# Patient Record
Sex: Female | Born: 1965 | Race: White | Hispanic: No | Marital: Married | Smoking: Never smoker
Health system: Southern US, Community
[De-identification: ages and names within clinical notes are randomized; demographics above are authoritative.]

## PROBLEM LIST (undated history)

## (undated) DIAGNOSIS — C8400 Mycosis fungoides, unspecified site: Secondary | ICD-10-CM

## (undated) DIAGNOSIS — I10 Essential (primary) hypertension: Secondary | ICD-10-CM

---

## 2003-11-12 ENCOUNTER — Ambulatory Visit: Payer: Self-pay

## 2013-02-06 ENCOUNTER — Ambulatory Visit: Payer: Self-pay | Admitting: Family Medicine

## 2013-07-16 DIAGNOSIS — C8400 Mycosis fungoides, unspecified site: Secondary | ICD-10-CM

## 2013-07-16 HISTORY — DX: Mycosis fungoides, unspecified site: C84.00

## 2013-08-13 ENCOUNTER — Ambulatory Visit
Admission: RE | Admit: 2013-08-13 | Discharge: 2013-08-13 | Disposition: A | Discharge status: Home / Self Care | Payer: BC Managed Care – PPO | Source: Ambulatory Visit | Attending: Dermatology | Admitting: Dermatology

## 2013-08-13 ENCOUNTER — Other Ambulatory Visit: Payer: Self-pay | Admitting: Dermatology

## 2013-08-13 DIAGNOSIS — R591 Generalized enlarged lymph nodes: Secondary | ICD-10-CM

## 2016-05-29 ENCOUNTER — Other Ambulatory Visit: Payer: Self-pay | Admitting: Student

## 2016-05-29 DIAGNOSIS — Z1231 Encounter for screening mammogram for malignant neoplasm of breast: Secondary | ICD-10-CM

## 2016-09-15 ENCOUNTER — Other Ambulatory Visit: Payer: Self-pay | Admitting: Student

## 2016-09-15 ENCOUNTER — Ambulatory Visit
Admission: RE | Admit: 2016-09-15 | Discharge: 2016-09-15 | Disposition: A | Discharge status: Home / Self Care | Payer: BC Managed Care – PPO | Source: Ambulatory Visit | Attending: Student | Admitting: Student

## 2016-09-15 DIAGNOSIS — Z1231 Encounter for screening mammogram for malignant neoplasm of breast: Secondary | ICD-10-CM

## 2016-09-15 HISTORY — DX: Mycosis fungoides, unspecified site: C84.00

## 2016-11-01 ENCOUNTER — Emergency Department (HOSPITAL_COMMUNITY)
Admission: EM | Admit: 2016-11-01 | Discharge: 2016-11-01 | Disposition: A | Discharge status: Home / Self Care | Payer: BC Managed Care – PPO | Attending: Emergency Medicine | Admitting: Emergency Medicine

## 2016-11-01 ENCOUNTER — Encounter (HOSPITAL_COMMUNITY): Payer: Self-pay | Admitting: Emergency Medicine

## 2016-11-01 ENCOUNTER — Emergency Department (HOSPITAL_COMMUNITY): Payer: BC Managed Care – PPO

## 2016-11-01 DIAGNOSIS — M545 Low back pain: Secondary | ICD-10-CM | POA: Insufficient documentation

## 2016-11-01 DIAGNOSIS — R079 Chest pain, unspecified: Secondary | ICD-10-CM | POA: Diagnosis present

## 2016-11-01 DIAGNOSIS — I10 Essential (primary) hypertension: Secondary | ICD-10-CM | POA: Insufficient documentation

## 2016-11-01 HISTORY — DX: Essential (primary) hypertension: I10

## 2016-11-01 LAB — BASIC METABOLIC PANEL
ANION GAP: 7 (ref 5–15)
BUN: 10 mg/dL (ref 6–20)
CO2: 23 mmol/L (ref 22–32)
Calcium: 9.2 mg/dL (ref 8.9–10.3)
Chloride: 107 mmol/L (ref 101–111)
Creatinine, Ser: 0.88 mg/dL (ref 0.44–1.00)
GFR calc Af Amer: >60 mL/min (ref >60)
Glucose, Bld: 129 mg/dL — ABNORMAL HIGH (ref 65–99)
POTASSIUM: 3.9 mmol/L (ref 3.5–5.1)
Sodium: 137 mmol/L (ref 135–145)

## 2016-11-01 LAB — CBC
HEMATOCRIT: 42.5 % (ref 36.0–46.0)
HEMOGLOBIN: 14.9 g/dL (ref 12.0–15.0)
MCH: 31.7 pg (ref 26.0–34.0)
MCHC: 35.1 g/dL (ref 30.0–36.0)
MCV: 90.4 fL (ref 78.0–100.0)
Platelets: 282 10*3/uL (ref 150–400)
RBC: 4.7 MIL/uL (ref 3.87–5.11)
RDW: 13.1 % (ref 11.5–15.5)
WBC: 8.8 10*3/uL (ref 4.0–10.5)

## 2016-11-01 LAB — I-STAT TROPONIN, ED: TROPONIN I, POC: 0 ng/mL (ref 0.00–0.08)

## 2016-11-01 NOTE — ED Provider Notes (Signed)
[facility] EMERGENCY DEPARTMENT Provider Note   CSN: [id number] Arrival date & time: 11/01/16  1535     History   Chief Complaint Chief Complaint  Patient presents with  . Chest Pain    HPI Laurie Pacheco is a 51 y.o. female.  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 Patient states that she was sitting at work today when she felt gradually worsening substernal chest "squeezing." States that the squeezing came and went, with a 5/10 in severity and went on for several hours. With this she also reports right scapular stabbing pain at times of chest squeezing. She denies associated shortness of breath, nausea/vomiting, diaphoresis, numbness, weakness, abdominal pain. Did not take any medications for her symptoms. She tried walking around her office, this did not change the sensation in her chest. She denies family history of MI. She denies smoking history, drinks 3 alcoholic beverages per evening for the past several years. She has never had exertional chest pain. States that she has had increased stress at work this week as she has had to lay off multiple employees at her job. Is asking if this could be related to stress. No history of DVT, exogenous estrogen use, no recent surgeries or immobilization.   Past Medical History:  Diagnosis Date  . Hypertension   . 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   History reviewed. No pertinent surgical history.  OB History    No data available       Home Medications    Prior to Admission medications   Medication Sig Start Date End Date Taking? Authorizing Provider  atorvastatin (LIPITOR) 10 MG tablet Take 10 mg by mouth daily.  10/29/16  Yes [provider]  losartan (COZAAR) 25 MG tablet Take 25 mg by mouth  daily. 10/28/16  Yes [provider]  PRESCRIPTION MEDICATION Apply topically See admin instructions. Nitrogen Mustard (Meclorethamine) Uses 4 to 5 times a week at night   Yes [provider]  UNABLE TO FIND 2 (two) times a week. Med Name: Light Sensitive Treatment  On Sunday and Thursday   Yes [provider]    Family History History reviewed. No pertinent family history.  Social History Social History  Substance Use Topics  . Smoking status: Never Smoker  . Smokeless tobacco: Never Used  . Alcohol use No     Allergies   Sulfa antibiotics   Review of Systems Review of Systems  Constitutional: Negative for chills, fatigue and fever.  HENT: Negative for congestion.   Eyes: Negative for visual disturbance.  Respiratory: Negative for cough and shortness of breath.   Cardiovascular: Positive for chest pain. Negative for leg swelling.  Gastrointestinal: Negative for abdominal pain, diarrhea, nausea and vomiting.  Genitourinary: Negative for difficulty urinating.  Musculoskeletal: Positive for back pain (right scapula). Negative for gait problem, joint swelling, neck pain and neck stiffness.  Skin: Negative for rash and wound.  Neurological: Negative for dizziness, weakness, light-headedness, numbness and headaches.  Psychiatric/Behavioral: Negative for agitation.     Physical Exam Updated Vital Signs BP (!) 124/92   Pulse 73   Temp 98.3 F (36.8 C) (Oral)   Resp 16   SpO2 97%   Physical Exam  Constitutional: She is oriented to person, place, and time. She appears well-developed and  well-nourished.  HENT:  Head: Normocephalic and atraumatic.  Mouth/Throat: Oropharynx is clear and moist. No oropharyngeal exudate.  Eyes: Pupils are equal, round, and reactive to light. Conjunctivae are normal. Right eye exhibits no discharge. Left eye exhibits no discharge. No scleral icterus.  Neck: Normal range of motion. Neck supple. No JVD present. No  tracheal deviation present.  Cardiovascular: Normal rate, regular rhythm and intact distal pulses.  Exam reveals no friction rub.   No murmur heard. Pulmonary/Chest: Effort normal and breath sounds normal. No respiratory distress. She has no wheezes. She has no rales.  No chest tenderness.   Abdominal: Soft. Bowel sounds are normal. She exhibits no distension and no mass. There is no tenderness. There is no guarding.  No CVA tenderness. Murphy's sign negative.   Musculoskeletal: Normal range of motion.  Mild tenderness to palpation over the right scapula. No tenderness over the thoracic spinous processes or lumbar spinous processes.  Lymphadenopathy:    She has no cervical adenopathy.  Neurological: She is alert and oriented to person, place, and time. Coordination normal.  Skin: Skin is warm and dry. Capillary refill takes less than 2 seconds.  Psychiatric: She has a normal mood and affect. Her behavior is normal.     ED Treatments / Results  Labs (all labs ordered are listed, but only abnormal results are displayed) Labs Reviewed  BASIC METABOLIC PANEL - Abnormal; Notable for the following:       Result Value   Glucose, Bld 129 (*)    All other components within normal limits  CBC  I-STAT TROPONIN, ED  I-STAT TROPONIN, ED    EKG  EKG Interpretation  Date/Time:  Wednesday November 01 2016 15:46:15 EDT Ventricular Rate:  91 PR Interval:  122 QRS Duration: 70 QT Interval:  328 QTC Calculation: 403 R Axis:   -21 Text Interpretation:  Normal sinus rhythm Low voltage QRS otherwise normal. no old comparison Confirmed by Charlesetta Shanks [phone]) on 11/01/2016 10:42:39 PM       Radiology Dg Chest 2 View  Result Date: 11/01/2016 CLINICAL DATA:  Chest pain EXAM: CHEST  2 VIEW COMPARISON:  08/13/2013 FINDINGS: Smoothly contoured opacity overlapping the heart in the lateral projection is attributed to mediastinal fat pad. There is no edema, consolidation, effusion, or  pneumothorax. No acute osseous finding. IMPRESSION: No active cardiopulmonary disease. Electronically Signed   By: Monte Fantasia M.D.   On: 11/01/2016 16:25    Procedures Procedures (including critical care time)  Medications Ordered in ED Medications - No data to display   Initial Impression / Assessment and Plan / ED Course  I have reviewed the triage vital signs and the nursing notes.  Pertinent labs & imaging results that were available during my care of the patient were reviewed by me and considered in my medical decision making (see chart for details).     Patient is to be discharged with recommendation to follow up with PCP in regards to today's hospital visit. Chest pain is not likely of cardiac or pulmonary etiology d/t presentation, VSS, no tracheal deviation, no JVD or new murmur, RRR, breath sounds equal bilaterally, EKG without acute abnormalities, troponin negative, and negative CXR. Do not suspect PE given patient is not tachycardic, tachypnic, no unilateral leg swelling, no hemoptysis, no history of DVT/PE, no recent history of immobility or surgery. Pt has been advised to return to the ED if CP becomes exertional, associated with diaphoresis or nausea, radiates to left jaw/arm, worsens or becomes concerning in  any way. Pt appears reliable for follow up and is agreeable to discharge. Case has been discussed with and seen by Dr. Colvin Caroli who agrees with the above plan to discharge.    Final Clinical Impressions(s) / ED Diagnoses   Final diagnoses:  Chest pain, unspecified type    New Prescriptions Discharge Medication List as of 11/01/2016 11:01 PM       Glyn Ade, PA-C 11/02/16 0132    Charlesetta Shanks, MD 11/06/16 [phone]

## 2016-11-01 NOTE — Discharge Instructions (Signed)
Your Chest X-ray was normal, your blood work was normal and EKG showed no signs of a heart attack.   Please schedule a follow up appointment with your primary care doctor regarding her ER visit today.  Please return to the emergency department if you experience chest pain with shortness of breath, chest pain that radiates to your left arm or jaw, chest pain with nausea/vomiting, chest pain with exertion or any new or worsening symptoms.

## 2016-11-01 NOTE — ED Triage Notes (Signed)
Pt to ER for sudden onset chest tightness while at rest this morning. States radiating into right neck and through to her back. No cardiac hx. States has high cholesterol and hypertension. A/o x4. NAD at present.

## 2016-11-01 NOTE — ED Notes (Signed)
Pt reported that she does not want to wait for a second blood test and that she is leaving. EDP aware.

## 2016-11-01 NOTE — ED Notes (Signed)
Pt verbalizes understanding of d/c instructions. Pt ambulatory at d/c with all belongings.   

## 2016-11-02 NOTE — ED Notes (Signed)
Pt states that she doesn't want to wait for any more tests and is ready to leave. Awaiting DC instructions.  MD aware of pt request.

## 2017-10-16 ENCOUNTER — Other Ambulatory Visit: Payer: Self-pay | Admitting: Family Medicine

## 2017-10-16 DIAGNOSIS — Z1231 Encounter for screening mammogram for malignant neoplasm of breast: Secondary | ICD-10-CM

## 2017-11-02 ENCOUNTER — Ambulatory Visit
Admission: RE | Admit: 2017-11-02 | Discharge: 2017-11-02 | Disposition: A | Discharge status: Home / Self Care | Payer: BC Managed Care – PPO | Source: Ambulatory Visit | Attending: Family Medicine | Admitting: Family Medicine

## 2017-11-02 DIAGNOSIS — Z1231 Encounter for screening mammogram for malignant neoplasm of breast: Secondary | ICD-10-CM | POA: Diagnosis not present

## 2018-10-12 IMAGING — MG MM DIGITAL SCREENING BILAT W/ TOMO W/ CAD
8 of 12 series · 8 of 28 positions shown · non-contrast
Comparison: Previous exam(s).

CLINICAL DATA: Screening.

EXAM:
2D DIGITAL SCREENING BILATERAL MAMMOGRAM WITH CAD AND ADJUNCT TOMO

[L CC synth-2D]
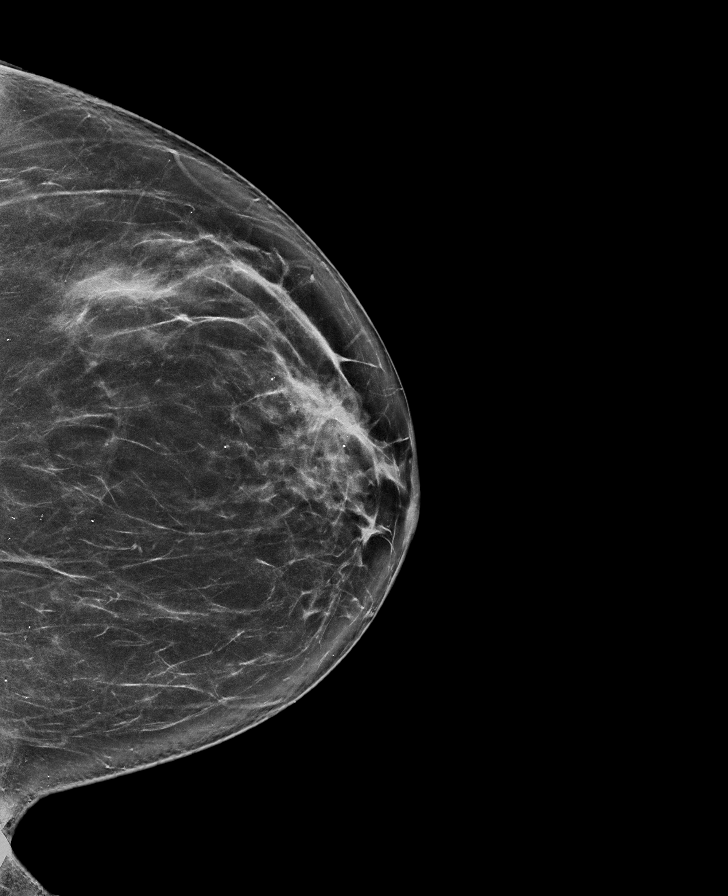

[R MLO]
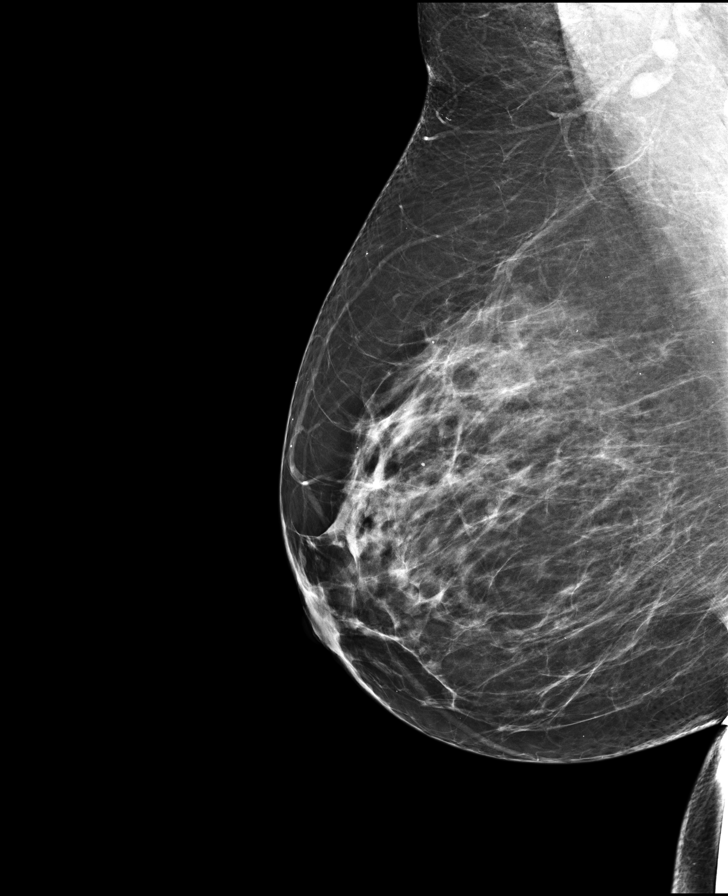

[L CC]
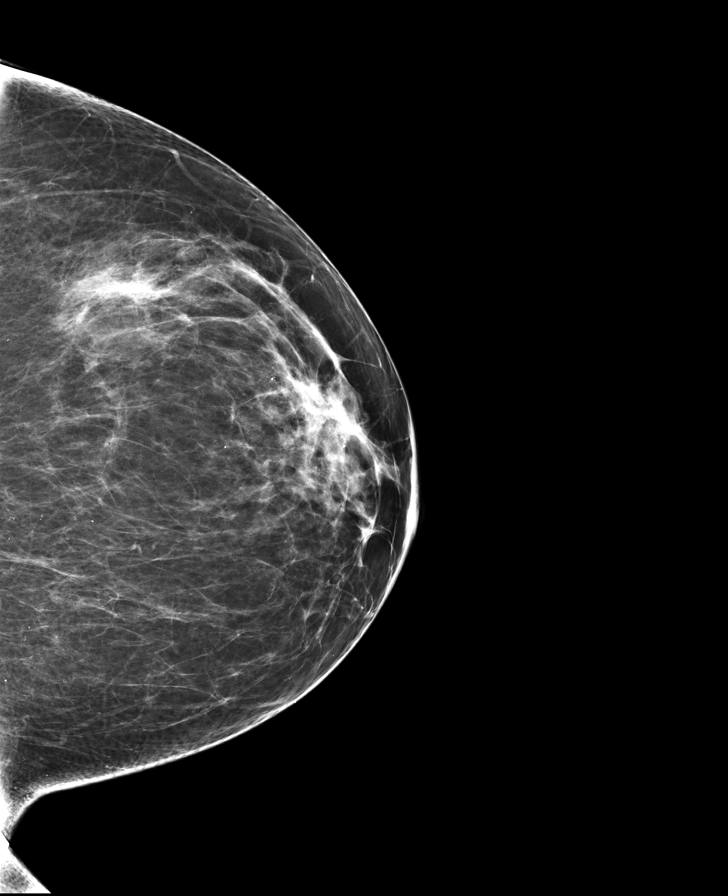

[R CC]
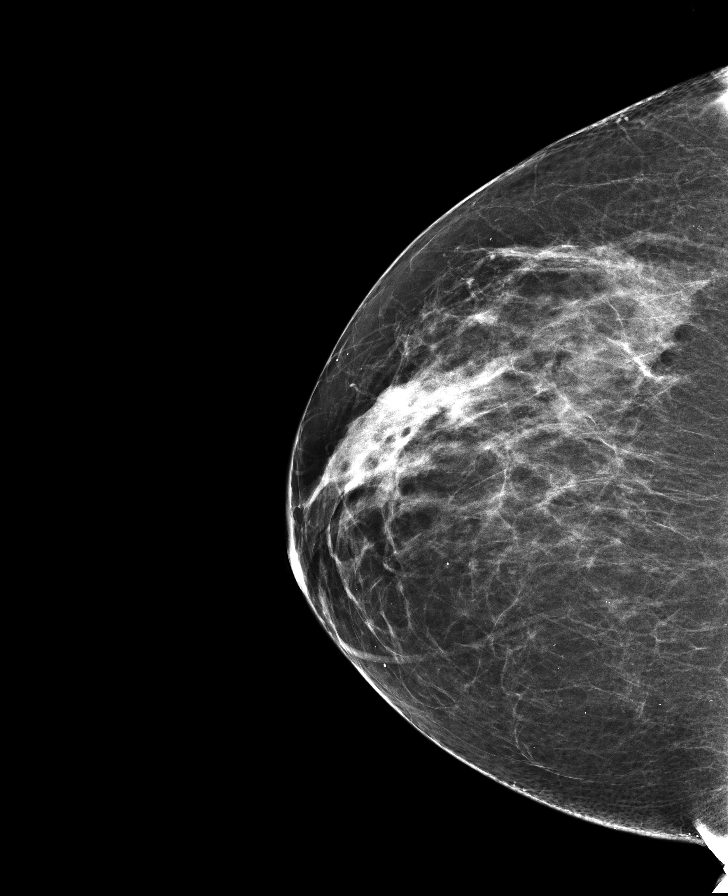

[L MLO]
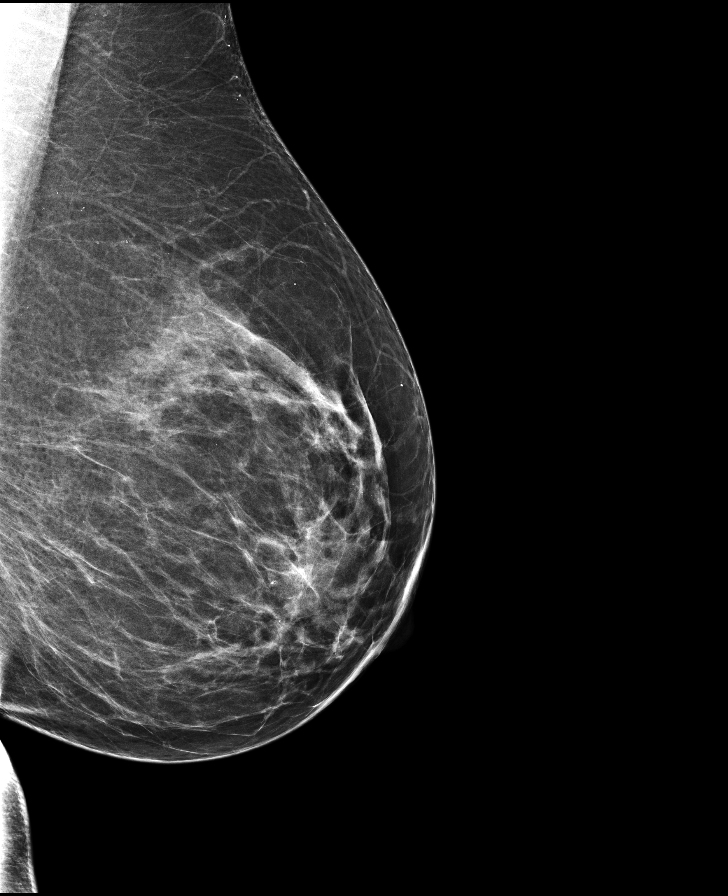

[R MLO synth-2D]
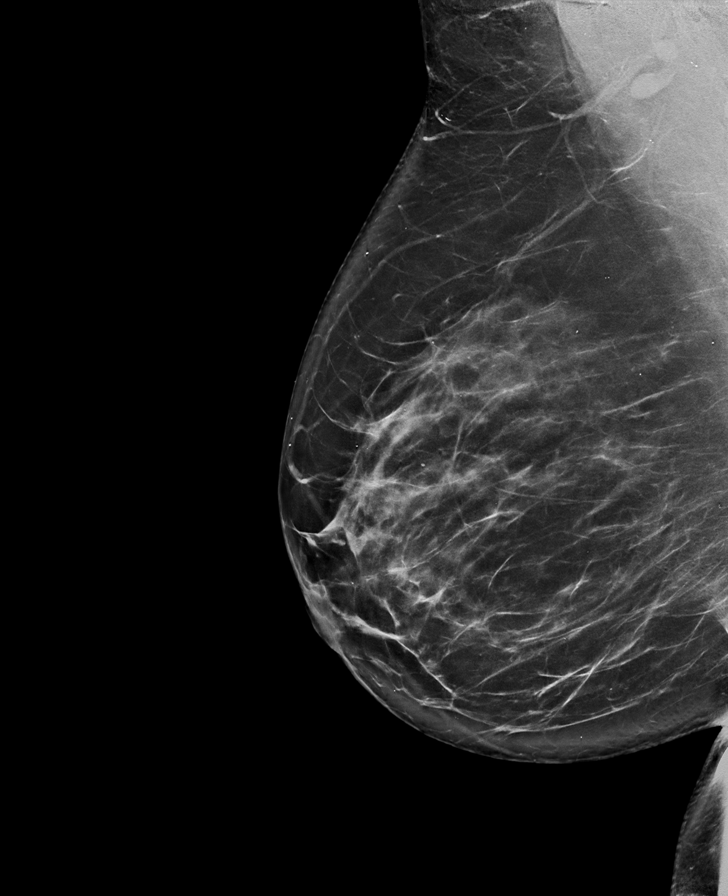

[L MLO synth-2D]
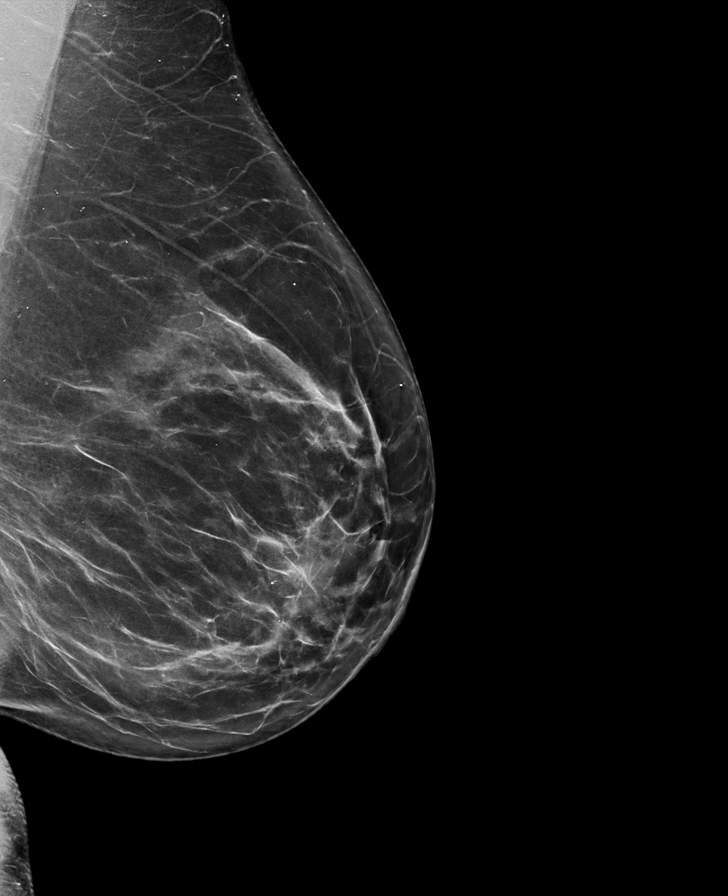

[R CC synth-2D]
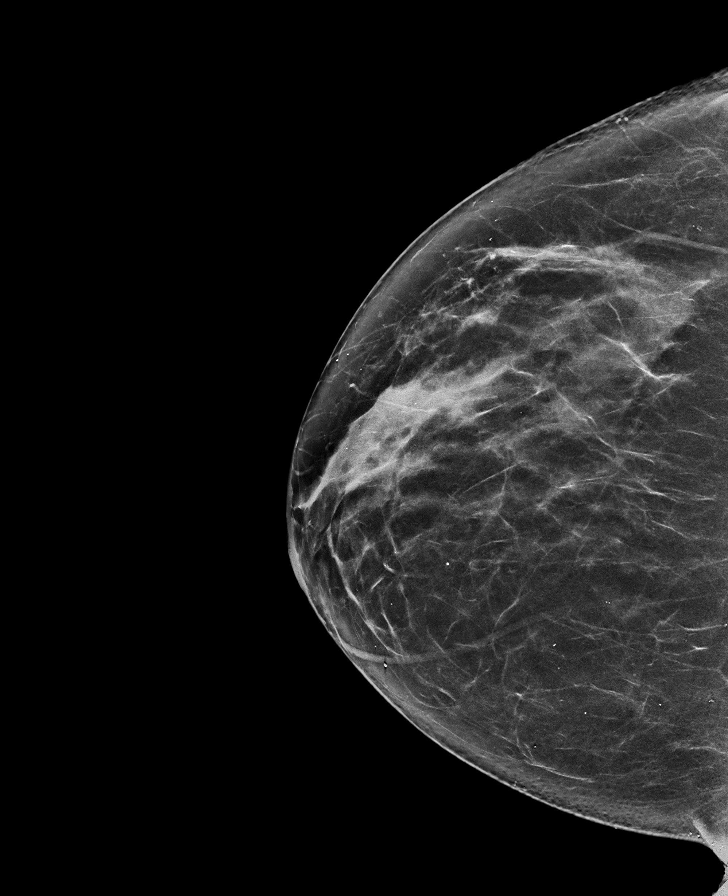

[8 of 28 positions shown; findings below may reference images not displayed]

ACR Breast Density Category b: There are scattered areas of
fibroglandular density.
FINDINGS: There are no findings suspicious for malignancy. Images were
processed with CAD.
IMPRESSION: No mammographic evidence of malignancy. A result letter of this
screening mammogram will be mailed directly to the patient.

RECOMMENDATION:
Screening mammogram in one year. (Code:97-6-RS4)

BI-RADS CATEGORY  1: Negative.

## 2018-10-15 ENCOUNTER — Other Ambulatory Visit: Payer: Self-pay | Admitting: Family Medicine

## 2018-10-15 DIAGNOSIS — Z1231 Encounter for screening mammogram for malignant neoplasm of breast: Secondary | ICD-10-CM

## 2018-11-19 ENCOUNTER — Ambulatory Visit
Admission: RE | Admit: 2018-11-19 | Discharge: 2018-11-19 | Disposition: A | Discharge status: Home / Self Care | Payer: BC Managed Care – PPO | Source: Ambulatory Visit | Attending: Family Medicine | Admitting: Family Medicine

## 2018-11-19 DIAGNOSIS — Z1231 Encounter for screening mammogram for malignant neoplasm of breast: Secondary | ICD-10-CM | POA: Diagnosis present

## 2019-03-22 ENCOUNTER — Ambulatory Visit: Payer: BC Managed Care – PPO | Attending: Internal Medicine

## 2019-03-22 DIAGNOSIS — Z23 Encounter for immunization: Secondary | ICD-10-CM

## 2019-03-22 NOTE — Progress Notes (Signed)
   Covid-19 Vaccination Clinic  Name:  Laurie Pacheco    MRN: [id number] DOB: [birth year]  03/22/2019  [name] was observed post Covid-19 immunization for 30 minutes based on pre-vaccination screening without incident. She was provided with Vaccine Information Sheet and instruction to access the V-Safe system.   [name] was instructed to call 911 with any severe reactions post vaccine: Marland Kitchen Difficulty breathing  . Swelling of face and throat  . A fast heartbeat  . A bad rash all over body  . Dizziness and weakness   Immunizations Administered    Name Date Dose VIS Date Route   Pfizer COVID-19 Vaccine 03/22/2019 11:20 AM 0.3 mL 12/27/2018 Intramuscular   Manufacturer: [place]   Lot: [id number]   [place]: [id number]

## 2019-04-12 ENCOUNTER — Ambulatory Visit: Payer: BC Managed Care – PPO | Attending: Internal Medicine

## 2019-04-12 DIAGNOSIS — Z23 Encounter for immunization: Secondary | ICD-10-CM

## 2019-04-12 NOTE — Progress Notes (Signed)
   Covid-19 Vaccination Clinic  Name:  Kindel Winkfield    MRN: [id number] DOB: 1965/08/11  04/12/2019  Ms. Rosi was observed post Covid-19 immunization for 30 minutes based on pre-vaccination screening without incident. She was provided with Vaccine Information Sheet and instruction to access the V-Safe system.   Ms. Dold was instructed to call 911 with any severe reactions post vaccine: Marland Kitchen Difficulty breathing  . Swelling of face and throat  . A fast heartbeat  . A bad rash all over body  . Dizziness and weakness   Immunizations Administered    Name Date Dose VIS Date Route   Pfizer COVID-19 Vaccine 04/12/2019 11:23 AM 0.3 mL 12/27/2018 Intramuscular   Manufacturer: [place]   Lot: [id number]   [place]: [id number]

## 2019-11-10 ENCOUNTER — Ambulatory Visit: Payer: BC Managed Care – PPO | Attending: Internal Medicine

## 2019-11-10 DIAGNOSIS — Z23 Encounter for immunization: Secondary | ICD-10-CM

## 2019-11-10 NOTE — Progress Notes (Signed)
   Covid-19 Vaccination Clinic  Name:  Laurie Pacheco    MRN: [id number] DOB: 10-Jul-1965  11/10/2019  [name] was observed post Covid-19 immunization for 30 minutes based on pre-vaccination screening without incident. She was provided with Vaccine Information Sheet and instruction to access the V-Safe system.   [name] was instructed to call 911 with any severe reactions post vaccine: Marland Kitchen Difficulty breathing  . Swelling of face and throat  . A fast heartbeat  . A bad rash all over body  . Dizziness and weakness

## 2020-04-26 ENCOUNTER — Ambulatory Visit: Payer: BC Managed Care – PPO

## 2020-09-21 LAB — EXTERNAL GENERIC LAB PROCEDURE: COLOGUARD: NEGATIVE

## 2021-09-26 ENCOUNTER — Other Ambulatory Visit: Payer: Self-pay | Admitting: Family Medicine

## 2021-09-26 DIAGNOSIS — Z1231 Encounter for screening mammogram for malignant neoplasm of breast: Secondary | ICD-10-CM

## 2022-01-14 ENCOUNTER — Telehealth: Payer: Self-pay | Admitting: Physician Assistant

## 2022-01-14 DIAGNOSIS — J40 Bronchitis, not specified as acute or chronic: Secondary | ICD-10-CM

## 2022-01-14 MED ORDER — ALBUTEROL SULFATE HFA 108 (90 BASE) MCG/ACT IN AERS: 2.0000 | INHALATION_SPRAY | Freq: Four times a day (QID) | RESPIRATORY_TRACT | 0 refills | Status: AC | PRN

## 2022-01-14 MED ORDER — DOXYCYCLINE HYCLATE 100 MG PO TABS: 100.0000 mg | ORAL_TABLET | Freq: Two times a day (BID) | ORAL | 0 refills | Status: AC

## 2022-01-14 MED ORDER — BENZONATATE 100 MG PO CAPS: 100.0000 mg | ORAL_CAPSULE | Freq: Three times a day (TID) | ORAL | 0 refills | Status: AC | PRN

## 2022-01-14 NOTE — Progress Notes (Signed)
We are sorry that you are not feeling well.  Here is how we plan to help!  Based on your presentation I believe you most likely have A cough due to bacteria.  When patients have a fever and a productive cough with a change in color or increased sputum production, we are concerned about bacterial bronchitis.  If left untreated it can progress to pneumonia.  If your symptoms do not improve with your treatment plan it is important that you contact your provider.   I have prescribed Doxycycline 100 mg twice a day for 7 days     In addition you may use A prescription cough medication called Tessalon Perles '100mg'$ . You may take 1-2 capsules every 8 hours as needed for your cough.  I have also sent in an inhaler for you to use as needed for wheezing or shortness of breath.   From your responses in the eVisit questionnaire you describe inflammation in the upper respiratory tract which is causing a significant cough.  This is commonly called Bronchitis and has four common causes:   Allergies Viral Infections Acid Reflux Bacterial Infection Allergies, viruses and acid reflux are treated by controlling symptoms or eliminating the cause. An example might be a cough caused by taking certain blood pressure medications. You stop the cough by changing the medication. Another example might be a cough caused by acid reflux. Controlling the reflux helps control the cough.  USE OF BRONCHODILATOR ("RESCUE") INHALERS: There is a risk from using your bronchodilator too frequently.  The risk is that over-reliance on a medication which only relaxes the muscles surrounding the breathing tubes can reduce the effectiveness of medications prescribed to reduce swelling and congestion of the tubes themselves.  Although you feel brief relief from the bronchodilator inhaler, your asthma may actually be worsening with the tubes becoming more swollen and filled with mucus.  This can delay other crucial treatments, such as oral steroid  medications. If you need to use a bronchodilator inhaler daily, several times per day, you should discuss this with your provider.  There are probably better treatments that could be used to keep your asthma under control.     HOME CARE Only take medications as instructed by your medical team. Complete the entire course of an antibiotic. Drink plenty of fluids and get plenty of rest. Avoid close contacts especially the very young and the elderly Cover your mouth if you cough or cough into your sleeve. Always remember to wash your hands A steam or ultrasonic humidifier can help congestion.   GET HELP RIGHT AWAY IF: You develop worsening fever. You become short of breath You cough up blood. Your symptoms persist after you have completed your treatment plan MAKE SURE YOU  Understand these instructions. Will watch your condition. Will get help right away if you are not doing well or get worse.    Thank you for choosing an e-visit.  Your e-visit answers were reviewed by a board certified advanced clinical practitioner to complete your personal care plan. Depending upon the condition, your plan could have included both over the counter or prescription medications.  Please review your pharmacy choice. Make sure the pharmacy is open so you can pick up prescription now. If there is a problem, you may contact your provider through [employer] and have the prescription routed to another pharmacy.  Your safety is important to [place]. If you have drug allergies check your prescription carefully.   For the next 24 hours you can  use MyChart to ask questions about today's visit, request a non-urgent call back, or ask for a work or school excuse. You will get an email in the next two days asking about your experience. I hope that your e-visit has been valuable and will speed your recovery.   I have spent 5 minutes in review of e-visit questionnaire, review and updating patient chart, medical decision  making and response to patient.   Lenise Arena Ward, PA-C

## 2022-01-23 ENCOUNTER — Ambulatory Visit
Admission: RE | Admit: 2022-01-23 | Discharge: 2022-01-23 | Disposition: A | Discharge status: Home / Self Care | Payer: BC Managed Care – PPO | Source: Ambulatory Visit | Attending: Family Medicine | Admitting: Family Medicine

## 2022-01-23 DIAGNOSIS — Z1231 Encounter for screening mammogram for malignant neoplasm of breast: Secondary | ICD-10-CM | POA: Diagnosis not present

## 2022-11-27 ENCOUNTER — Other Ambulatory Visit: Payer: Self-pay | Admitting: Internal Medicine

## 2022-11-27 DIAGNOSIS — Z8249 Family history of ischemic heart disease and other diseases of the circulatory system: Secondary | ICD-10-CM

## 2022-11-30 ENCOUNTER — Ambulatory Visit
Admission: RE | Admit: 2022-11-30 | Discharge: 2022-11-30 | Disposition: A | Discharge status: Home / Self Care | Payer: BC Managed Care – PPO | Source: Ambulatory Visit | Attending: Internal Medicine | Admitting: Internal Medicine

## 2022-11-30 DIAGNOSIS — Z8249 Family history of ischemic heart disease and other diseases of the circulatory system: Secondary | ICD-10-CM | POA: Insufficient documentation

## 2023-04-23 ENCOUNTER — Other Ambulatory Visit: Payer: Self-pay | Admitting: Family Medicine

## 2023-04-23 DIAGNOSIS — Z1231 Encounter for screening mammogram for malignant neoplasm of breast: Secondary | ICD-10-CM

## 2023-05-07 ENCOUNTER — Ambulatory Visit
Admission: RE | Admit: 2023-05-07 | Discharge: 2023-05-07 | Disposition: A | Discharge status: Home / Self Care | Source: Ambulatory Visit | Attending: Family Medicine | Admitting: Family Medicine

## 2023-05-07 DIAGNOSIS — Z1231 Encounter for screening mammogram for malignant neoplasm of breast: Secondary | ICD-10-CM | POA: Diagnosis present
